# Patient Record
Sex: Female | Born: 2000 | Race: White | Hispanic: No | Marital: Single | Smoking: Never smoker
Health system: Southern US, Community
[De-identification: ages and names within clinical notes are randomized; demographics above are authoritative.]

## PROBLEM LIST (undated history)

## (undated) DIAGNOSIS — B009 Herpesviral infection, unspecified: Secondary | ICD-10-CM

## (undated) HISTORY — PX: WISDOM TOOTH EXTRACTION: SHX21

## (undated) HISTORY — DX: Herpesviral infection, unspecified: B00.9

---
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
## 2019-02-04 ENCOUNTER — Other Ambulatory Visit: Payer: Self-pay | Admitting: Women's Health

## 2019-02-04 DIAGNOSIS — Z3041 Encounter for surveillance of contraceptive pills: Secondary | ICD-10-CM

## 2019-03-20 IMAGING — US US ABDOMEN LIMITED
1 series · 10 of 10 positions shown · non-contrast
Comparison: None.

CLINICAL DATA: Mononucleosis.  Evaluate spleen size.

EXAM:
LIMITED ABDOMINAL ULTRASOUND

[Series 1: us abdomen limited · 0.20mm/px · 10 of 10 slices shown]
[im 1/10]
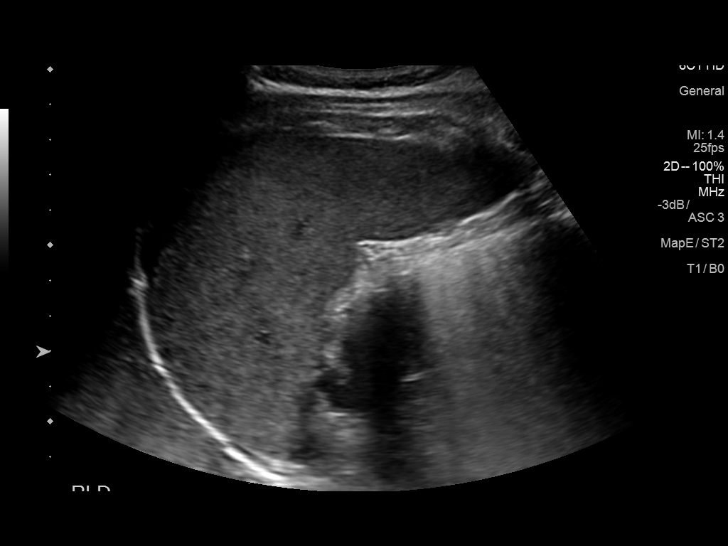
[im 2/10]
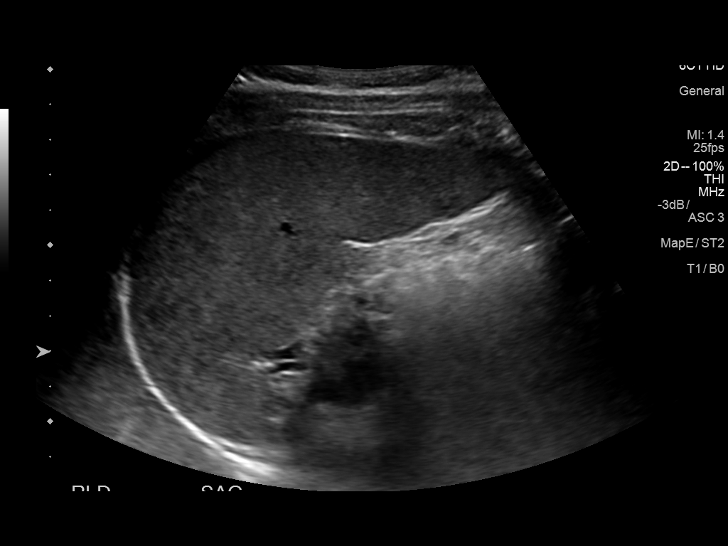
[im 3/10]
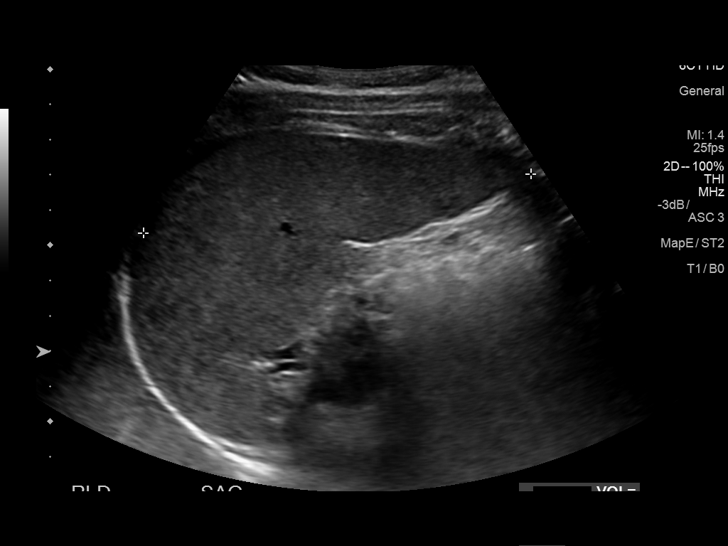
[im 4/10]
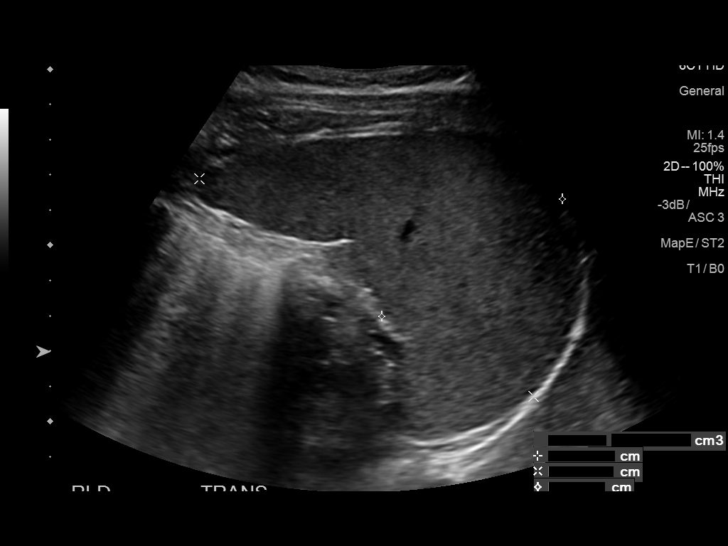
[im 5/10]
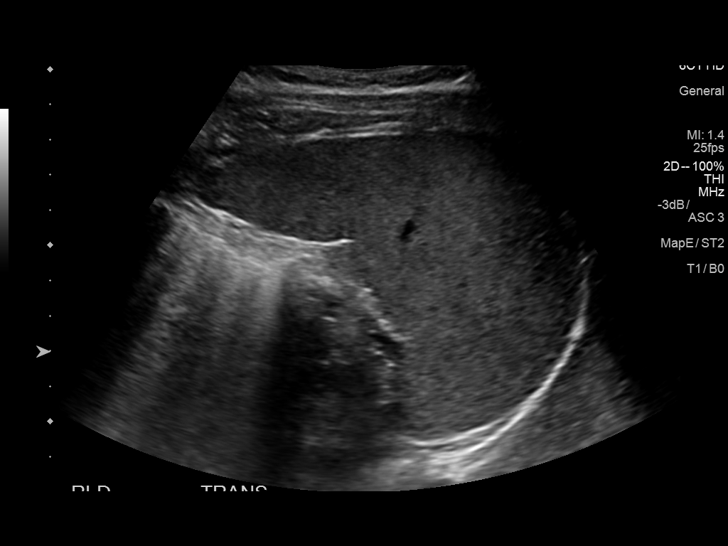
[im 6/10]
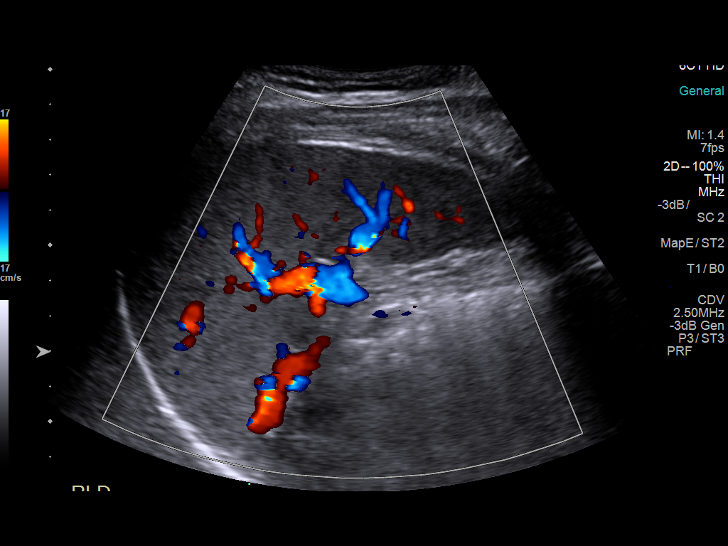
[im 7/10]
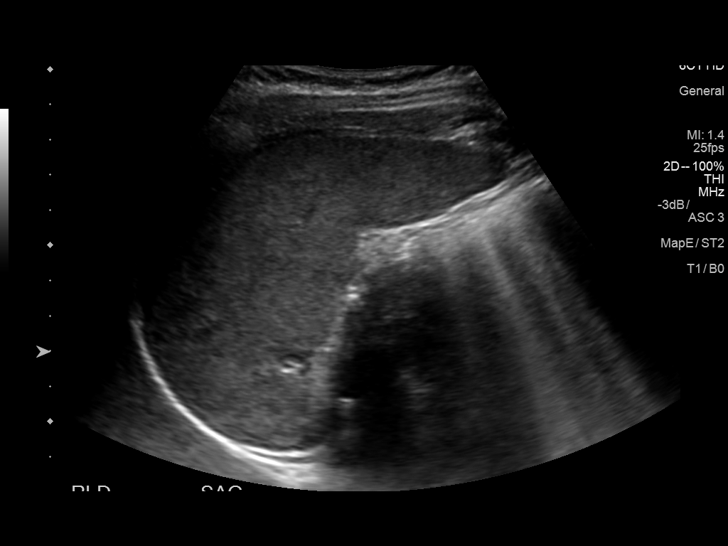
[im 8/10]
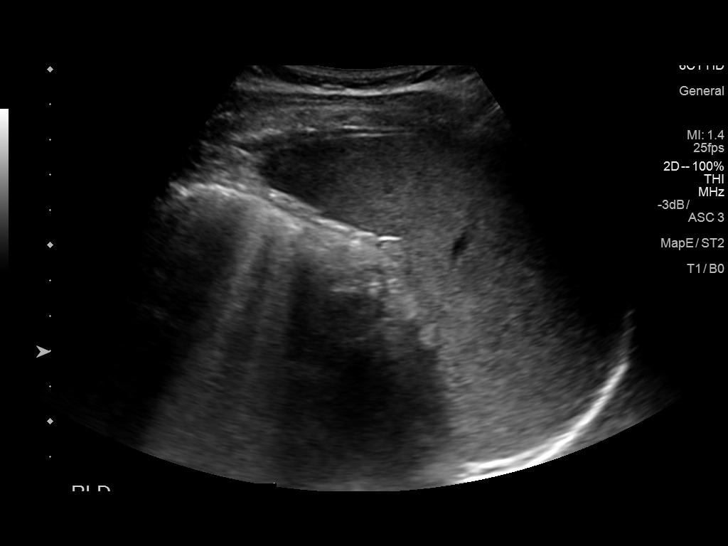
[im 9/10]
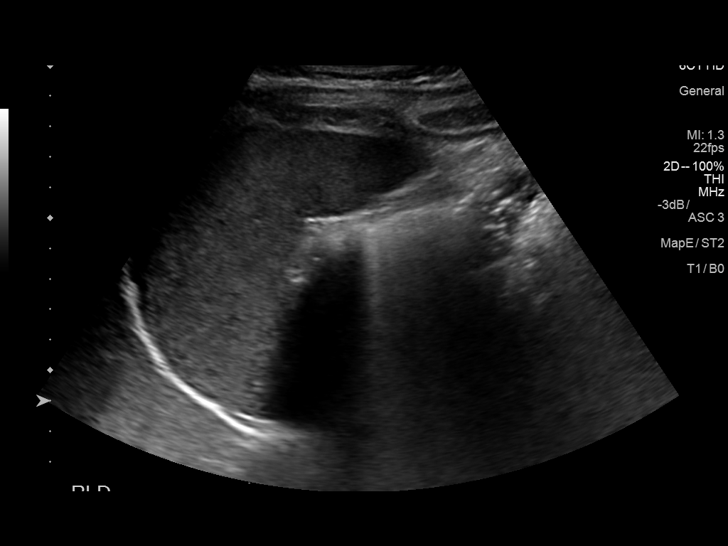
[im 10/10]
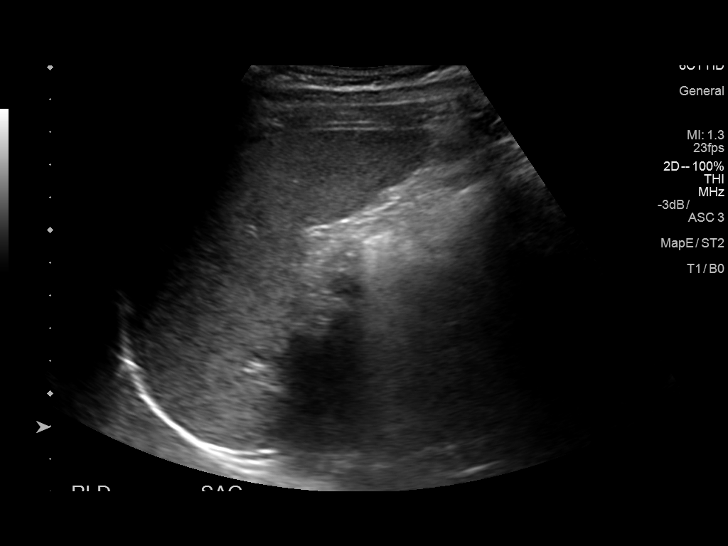

[10 of 10 positions shown; findings below may reference images not displayed]

FINDINGS: Spleen size 11.1 x 11.3 x 6.1 cm for a volume of 402 mL, upper
normal in size. No focal abnormality.
IMPRESSION: No evidence of splenomegaly or focal splenic lesions.

## 2019-03-30 ENCOUNTER — Other Ambulatory Visit: Payer: Self-pay | Admitting: Women's Health

## 2019-03-30 DIAGNOSIS — Z3041 Encounter for surveillance of contraceptive pills: Secondary | ICD-10-CM

## 2019-04-09 IMAGING — CR DG CHEST 2V
2 series · 2 of 2 positions shown · non-contrast
Comparison: Abdominal series today reported separately.

CLINICAL DATA: 15-year-old female with left upper quadrant pain,
shortness of breath. Mononucleosis.

EXAM:
CHEST  2 VIEW

[w chest pa]
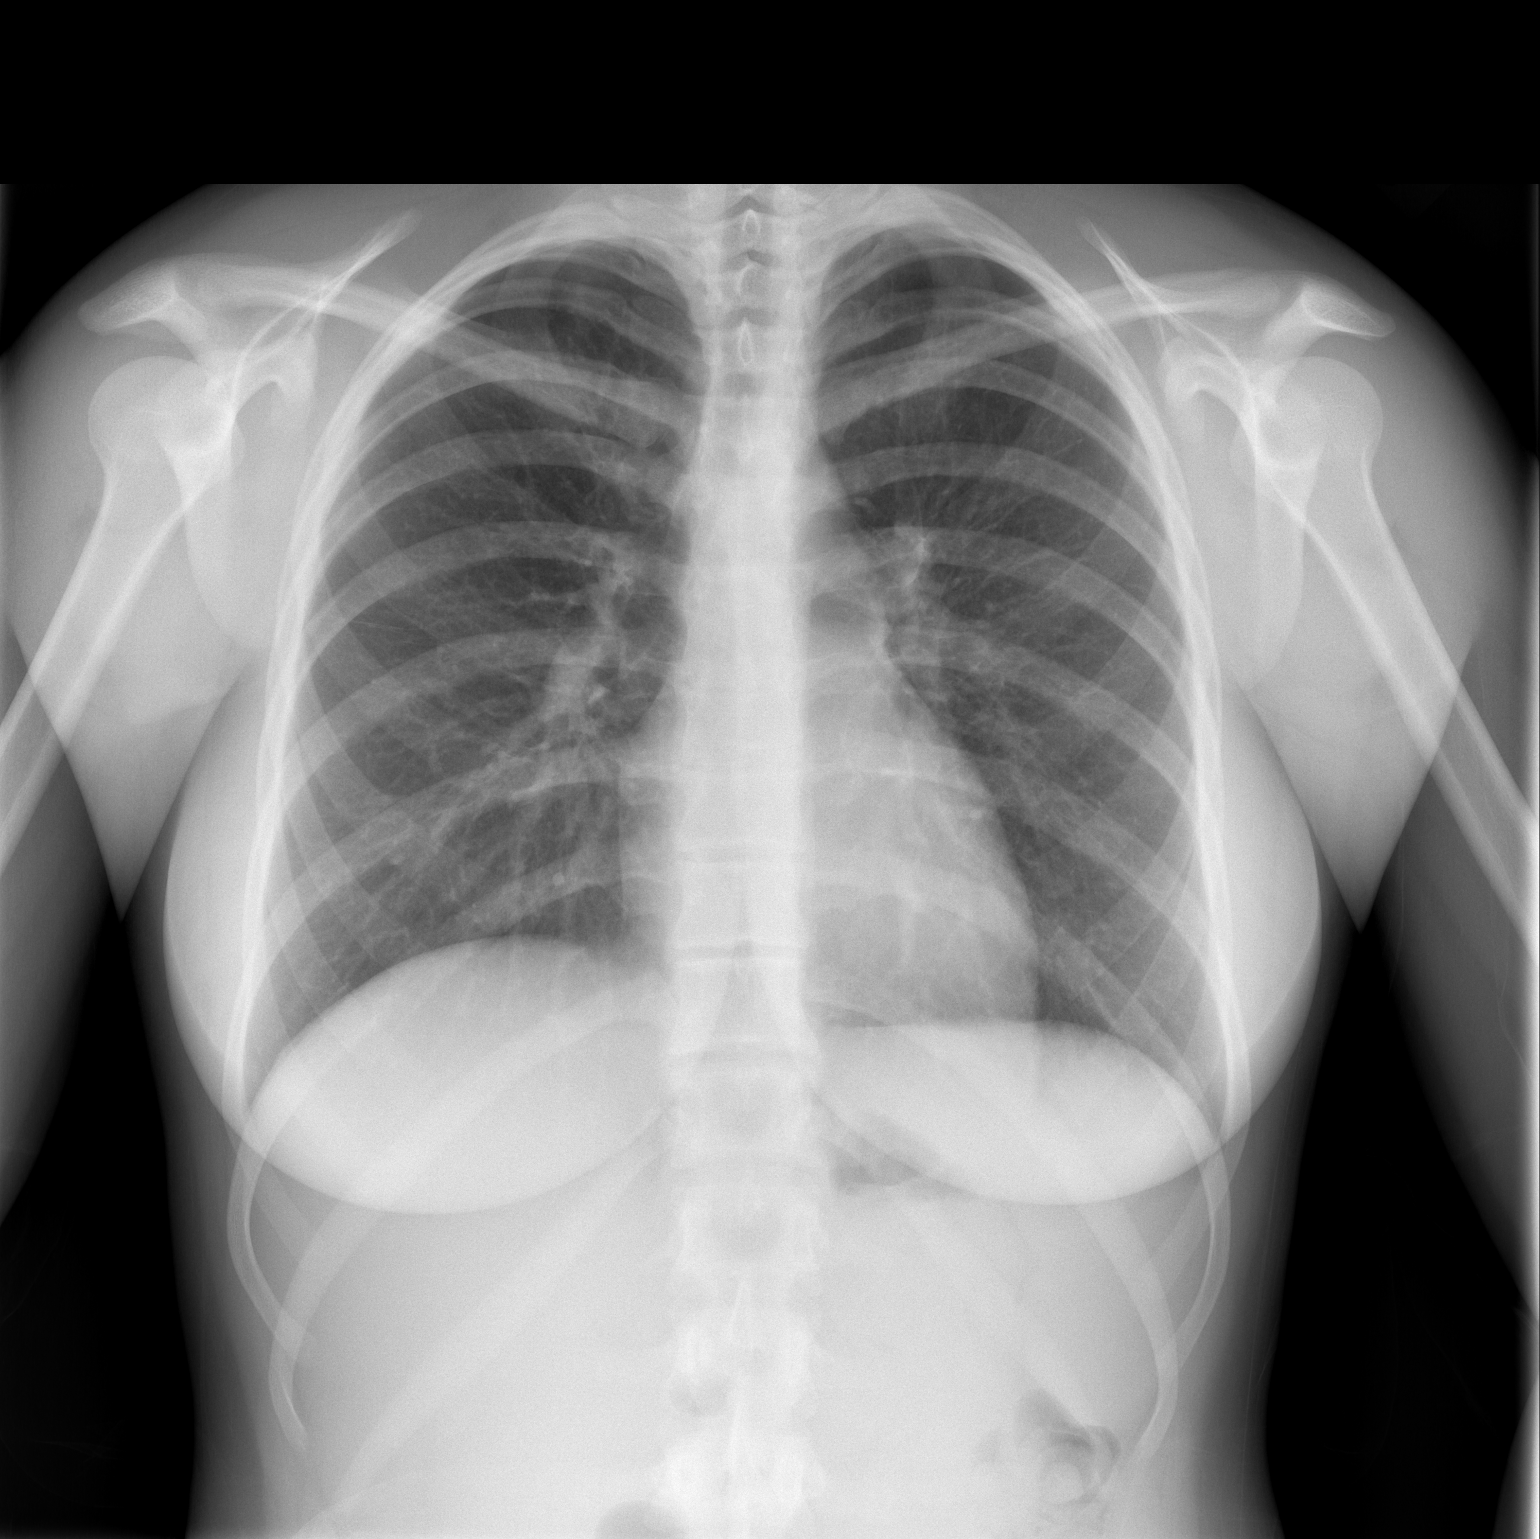

[w chest lat]
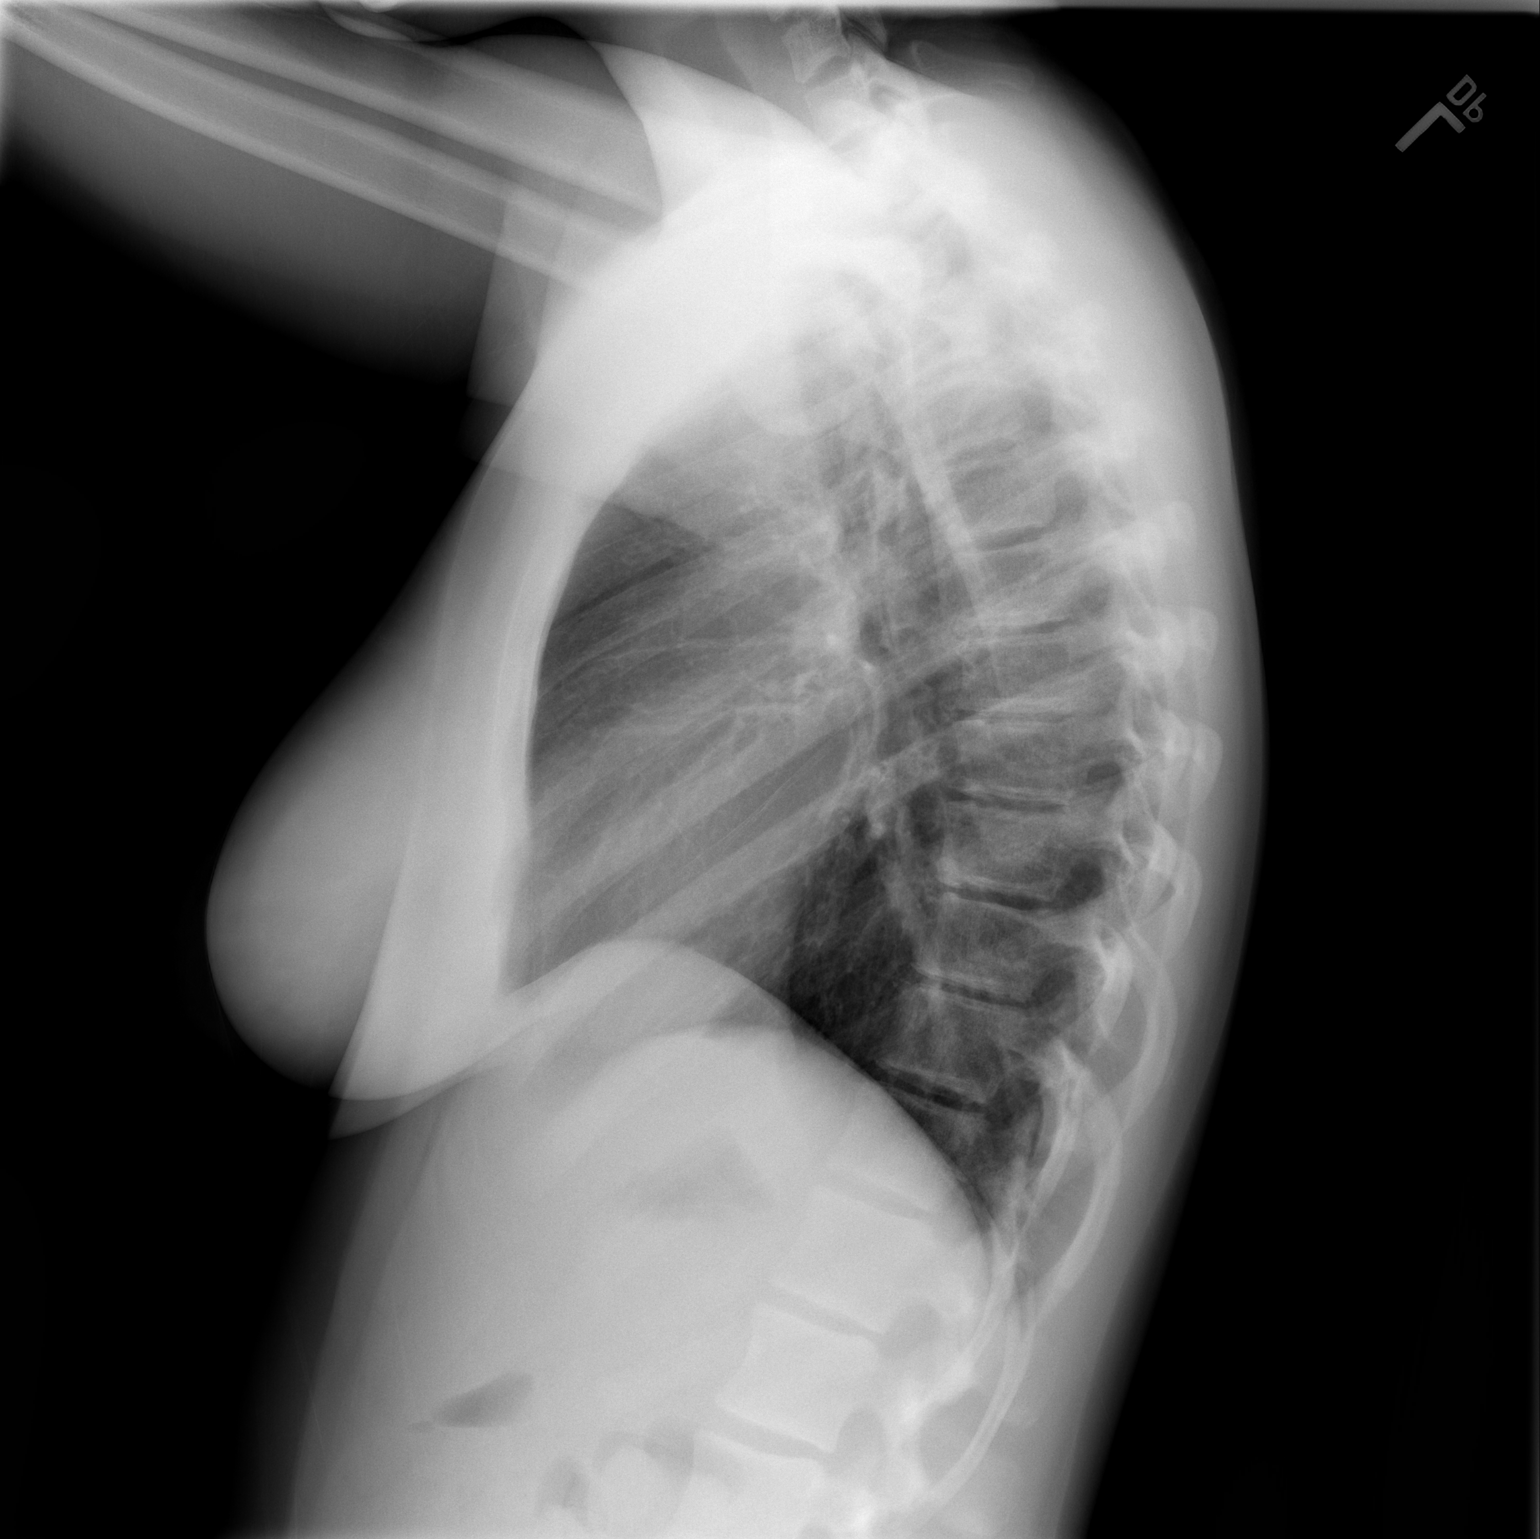

[2 of 2 positions shown; findings below may reference images not displayed]

FINDINGS: Normal lung volumes. Normal cardiac size and mediastinal contours.
Visualized tracheal air column is within normal limits. The lungs
are clear. No pneumothorax or pleural effusion. No pneumoperitoneum.
Negative visible bowel gas pattern. No osseous abnormality
identified.
IMPRESSION: Negative.  No acute cardiopulmonary abnormality.

## 2019-04-10 ENCOUNTER — Telehealth: Payer: Self-pay | Admitting: *Deleted

## 2019-04-10 DIAGNOSIS — Z3041 Encounter for surveillance of contraceptive pills: Secondary | ICD-10-CM

## 2019-04-10 MED ORDER — NORTREL 1/35 (21) 1-35 MG-MCG PO TABS: ORAL_TABLET | ORAL | 0 refills | Status: DC

## 2019-04-10 NOTE — Telephone Encounter (Signed)
Patient has annual exam scheduled on 04/18/19 needs refill on birth control pills. Rx sent.

## 2019-04-17 ENCOUNTER — Other Ambulatory Visit: Payer: Self-pay

## 2019-04-18 ENCOUNTER — Ambulatory Visit (INDEPENDENT_AMBULATORY_CARE_PROVIDER_SITE_OTHER): Payer: Self-pay | Admitting: Women's Health

## 2019-04-18 ENCOUNTER — Encounter: Payer: Self-pay | Admitting: Women's Health

## 2019-04-18 VITALS — BP 124/80 | Ht 65.0 in | Wt 149.0 lb

## 2019-04-18 DIAGNOSIS — Z01419 Encounter for gynecological examination (general) (routine) without abnormal findings: Secondary | ICD-10-CM

## 2019-04-18 MED ORDER — NORGESTIMATE-ETH ESTRADIOL 0.25-35 MG-MCG PO TABS: 1.0000 | ORAL_TABLET | Freq: Every day | ORAL | 4 refills | Status: DC

## 2019-04-18 MED ORDER — ACYCLOVIR 200 MG PO CAPS: 200.0000 mg | ORAL_CAPSULE | Freq: Every day | ORAL | 4 refills | Status: DC

## 2019-04-18 NOTE — Progress Notes (Signed)
[place] D Catterton Mar 01, 2001 [id number]    History:    Presents for annual exam.  Monthly cycle on Nortrel.  Mother lost her job/insurance, increased stress.  Questionable HSV outbreak.  Not sexually active, denies need for STD screen.  History of HSV greater than 1 year ago only one outbreak.  Gardasil series completed.  Past medical history, past surgical history, family history and social history were all reviewed and documented in the EPIC chart.  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  Mother hypertension,.  ROS:  A ROS was performed and pertinent positives and negatives are included.  Exam:  Vitals:   04/18/19 1407  BP: 124/80  Weight: 149 lb (67.6 kg)  Height: 5\' 5"  (1.651 m)   Body mass index is 24.79 kg/m.   General appearance:  Normal Thyroid:  Symmetrical, normal in size, without palpable masses or nodularity. Respiratory  Auscultation:  Clear without wheezing or rhonchi Cardiovascular  Auscultation:  Regular rate, without rubs, murmurs or gallops  Edema/varicosities:  Not grossly evident Abdominal  Soft,nontender, without masses, guarding or rebound.  Liver/spleen:  No organomegaly noted  Hernia:  None appreciated  Skin  Inspection:  Grossly normal   Breasts: Examined lying and sitting.     Right: Without masses, retractions, discharge or axillary adenopathy.     Left: Without masses, retractions, discharge or axillary adenopathy. Gentitourinary   Inguinal/mons:  Normal without inguinal adenopathy  External genitalia: Right labia several HSV appearing lesions  BUS/Urethra/Skene's glands:  Normal  Vagina:  Normal  Cervix:  Normal  Uterus:   normal in size, shape and contour.  Midline and mobile  Adnexa/parametria:     Rt: Without masses or tenderness.   Lt: Without masses or tenderness.  Anus and perineum: Normal   Assessment/Plan:  18 y.o. S WF G0 for annual exam with complaint of rt labia discomfort without  itching or odor..     Monthly cycle Nortrel HSV 2 outbreak  Plan: Sprintec prescription, proper use, slight risk for blood clots and strokes reviewed.  Reviewed very similar to Nortrel but will cost less at [facility]..  Encouraged condoms when sexually active, denies need for STD screen.  Acyclovir 200 mg 5 times daily as needed as needed, will use the Valtrex she has at home twice daily.  SBEs, exercise, calcium rich foods, sunscreen, MVI daily encouraged.  Campus safety discussed.  Declined blood work.  Pap screening guidelines reviewed.    Huel Cote [facility], 2:52 PM 04/18/2019

## 2019-04-18 NOTE — Patient Instructions (Signed)
Health Maintenance, Female Adopting a healthy lifestyle and getting preventive care are important in promoting health and wellness. Ask your health care provider about:  The right schedule for you to have regular tests and exams.  Things you can do on your own to prevent diseases and keep yourself healthy. What should I know about diet, weight, and exercise? Eat a healthy diet   Eat a diet that includes plenty of vegetables, fruits, low-fat dairy products, and lean protein.  Do not eat a lot of foods that are high in solid fats, added sugars, or sodium. Maintain a healthy weight Body mass index (BMI) is used to identify weight problems. It estimates body fat based on height and weight. Your health care provider can help determine your BMI and help you achieve or maintain a healthy weight. Get regular exercise Get regular exercise. This is one of the most important things you can do for your health. Most adults should:  Exercise for at least 150 minutes each week. The exercise should increase your heart rate and make you sweat (moderate-intensity exercise).  Do strengthening exercises at least twice a week. This is in addition to the moderate-intensity exercise.  Spend less time sitting. Even light physical activity can be beneficial. Watch cholesterol and blood lipids Have your blood tested for lipids and cholesterol at 20 years of age, then have this test every 5 years. Have your cholesterol levels checked more often if:  Your lipid or cholesterol levels are high.  You are older than 19 years of age.  You are at high risk for heart disease. What should I know about cancer screening? Depending on your health history and family history, you may need to have cancer screening at various ages. This may include screening for:  Breast cancer.  Cervical cancer.  Colorectal cancer.  Skin cancer.  Lung cancer. What should I know about heart disease, diabetes, and high blood  pressure? Blood pressure and heart disease  High blood pressure causes heart disease and increases the risk of stroke. This is more likely to develop in people who have high blood pressure readings, are of African descent, or are overweight.  Have your blood pressure checked: ? Every 3-5 years if you are 18-39 years of age. ? Every year if you are 40 years old or older. Diabetes Have regular diabetes screenings. This checks your fasting blood sugar level. Have the screening done:  Once every three years after age 40 if you are at a normal weight and have a low risk for diabetes.  More often and at a younger age if you are overweight or have a high risk for diabetes. What should I know about preventing infection? Hepatitis B If you have a higher risk for hepatitis B, you should be screened for this virus. Talk with your health care provider to find out if you are at risk for hepatitis B infection. Hepatitis C Testing is recommended for:  Everyone born from 1945 through 1965.  Anyone with known risk factors for hepatitis C. Sexually transmitted infections (STIs)  Get screened for STIs, including gonorrhea and chlamydia, if: ? You are sexually active and are younger than 19 years of age. ? You are older than 19 years of age and your health care provider tells you that you are at risk for this type of infection. ? Your sexual activity has changed since you were last screened, and you are at increased risk for chlamydia or gonorrhea. Ask your health care provider if   you are at risk.  Ask your health care provider about whether you are at high risk for HIV. Your health care provider may recommend a prescription medicine to help prevent HIV infection. If you choose to take medicine to prevent HIV, you should first get tested for HIV. You should then be tested every 3 months for as long as you are taking the medicine. Pregnancy  If you are about to stop having your period (premenopausal) and  you may become pregnant, seek counseling before you get pregnant.  Take 400 to 800 micrograms (mcg) of folic acid every day if you become pregnant.  Ask for birth control (contraception) if you want to prevent pregnancy. Osteoporosis and menopause Osteoporosis is a disease in which the bones lose minerals and strength with aging. This can result in bone fractures. If you are 65 years old or older, or if you are at risk for osteoporosis and fractures, ask your health care provider if you should:  Be screened for bone loss.  Take a calcium or vitamin D supplement to lower your risk of fractures.  Be given hormone replacement therapy (HRT) to treat symptoms of menopause. Follow these instructions at home: Lifestyle  Do not use any products that contain nicotine or tobacco, such as cigarettes, e-cigarettes, and chewing tobacco. If you need help quitting, ask your health care provider.  Do not use street drugs.  Do not share needles.  Ask your health care provider for help if you need support or information about quitting drugs. Alcohol use  Do not drink alcohol if: ? Your health care provider tells you not to drink. ? You are pregnant, may be pregnant, or are planning to become pregnant.  If you drink alcohol: ? Limit how much you use to 0-1 drink a day. ? Limit intake if you are breastfeeding.  Be aware of how much alcohol is in your drink. In the [place] one drink equals one 12 oz bottle of beer (355 mL), one 5 oz glass of wine (148 mL), or one 1 oz glass of hard liquor (44 mL). General instructions  Schedule regular health, dental, and eye exams.  Stay current with your vaccines.  Tell your health care provider if: ? You often feel depressed. ? You have ever been abused or do not feel safe at home. Summary  Adopting a healthy lifestyle and getting preventive care are important in promoting health and wellness.  Follow your health care provider's instructions about healthy  diet, exercising, and getting tested or screened for diseases.  Follow your health care provider's instructions on monitoring your cholesterol and blood pressure. This information is not intended to replace advice given to you by your health care provider. Make sure you discuss any questions you have with your health care provider. Document Released: 04/05/2011 Document Revised: 09/13/2018 Document Reviewed: 09/13/2018 Elsevier Patient Education  2020 Elsevier Inc.  

## 2020-05-15 ENCOUNTER — Ambulatory Visit (INDEPENDENT_AMBULATORY_CARE_PROVIDER_SITE_OTHER): Payer: Medicaid Other | Admitting: Nurse Practitioner

## 2020-05-15 ENCOUNTER — Other Ambulatory Visit: Payer: Self-pay

## 2020-05-15 ENCOUNTER — Encounter: Payer: Self-pay | Admitting: Nurse Practitioner

## 2020-05-15 VITALS — BP 116/72 | Ht 65.0 in | Wt 137.0 lb

## 2020-05-15 DIAGNOSIS — N898 Other specified noninflammatory disorders of vagina: Secondary | ICD-10-CM | POA: Diagnosis not present

## 2020-05-15 DIAGNOSIS — B373 Candidiasis of vulva and vagina: Secondary | ICD-10-CM

## 2020-05-15 DIAGNOSIS — B360 Pityriasis versicolor: Secondary | ICD-10-CM

## 2020-05-15 DIAGNOSIS — B009 Herpesviral infection, unspecified: Secondary | ICD-10-CM | POA: Diagnosis not present

## 2020-05-15 DIAGNOSIS — R300 Dysuria: Secondary | ICD-10-CM | POA: Diagnosis not present

## 2020-05-15 DIAGNOSIS — Z01419 Encounter for gynecological examination (general) (routine) without abnormal findings: Secondary | ICD-10-CM | POA: Diagnosis not present

## 2020-05-15 DIAGNOSIS — Z3041 Encounter for surveillance of contraceptive pills: Secondary | ICD-10-CM | POA: Diagnosis not present

## 2020-05-15 DIAGNOSIS — B3731 Acute candidiasis of vulva and vagina: Secondary | ICD-10-CM

## 2020-05-15 DIAGNOSIS — A609 Anogenital herpesviral infection, unspecified: Secondary | ICD-10-CM

## 2020-05-15 LAB — WET PREP FOR TRICH, YEAST, CLUE

## 2020-05-15 MED ORDER — NORGESTIMATE-ETH ESTRADIOL 0.25-35 MG-MCG PO TABS: 1.0000 | ORAL_TABLET | Freq: Every day | ORAL | 4 refills | Status: DC

## 2020-05-15 MED ORDER — KETOCONAZOLE 2 % EX CREA: 1.0000 "application " | TOPICAL_CREAM | Freq: Every day | CUTANEOUS | 0 refills | Status: AC

## 2020-05-15 MED ORDER — FLUCONAZOLE 150 MG PO TABS: 150.0000 mg | ORAL_TABLET | Freq: Once | ORAL | 0 refills | Status: AC

## 2020-05-15 MED ORDER — ACYCLOVIR 200 MG PO CAPS: 200.0000 mg | ORAL_CAPSULE | Freq: Every day | ORAL | 0 refills | Status: DC

## 2020-05-15 MED ORDER — VALACYCLOVIR HCL 500 MG PO TABS: ORAL_TABLET | ORAL | 1 refills | Status: AC

## 2020-05-15 NOTE — Patient Instructions (Addendum)
Apply cream to rash for 1 week after its gone  Health Maintenance, Female Adopting a healthy lifestyle and getting preventive care are important in promoting health and wellness. Ask your health care provider about:  The right schedule for you to have regular tests and exams.  Things you can do on your own to prevent diseases and keep yourself healthy. What should I know about diet, weight, and exercise? Eat a healthy diet   Eat a diet that includes plenty of vegetables, fruits, low-fat dairy products, and lean protein.  Do not eat a lot of foods that are high in solid fats, added sugars, or sodium. Maintain a healthy weight Body mass index (BMI) is used to identify weight problems. It estimates body fat based on height and weight. Your health care provider can help determine your BMI and help you achieve or maintain a healthy weight. Get regular exercise Get regular exercise. This is one of the most important things you can do for your health. Most adults should:  Exercise for at least 150 minutes each week. The exercise should increase your heart rate and make you sweat (moderate-intensity exercise).  Do strengthening exercises at least twice a week. This is in addition to the moderate-intensity exercise.  Spend less time sitting. Even light physical activity can be beneficial. Watch cholesterol and blood lipids Have your blood tested for lipids and cholesterol at 20 years of age, then have this test every 5 years. Have your cholesterol levels checked more often if:  Your lipid or cholesterol levels are high.  You are older than 20 years of age.  You are at high risk for heart disease. What should I know about cancer screening? Depending on your health history and family history, you may need to have cancer screening at various ages. This may include screening for:  Breast cancer.  Cervical cancer.  Colorectal cancer.  Skin cancer.  Lung cancer. What should I know  about heart disease, diabetes, and high blood pressure? Blood pressure and heart disease  High blood pressure causes heart disease and increases the risk of stroke. This is more likely to develop in people who have high blood pressure readings, are of African descent, or are overweight.  Have your blood pressure checked: ? Every 3-5 years if you are 78-68 years of age. ? Every year if you are 37 years old or older. Diabetes Have regular diabetes screenings. This checks your fasting blood sugar level. Have the screening done:  Once every three years after age 72 if you are at a normal weight and have a low risk for diabetes.  More often and at a younger age if you are overweight or have a high risk for diabetes. What should I know about preventing infection? Hepatitis B If you have a higher risk for hepatitis B, you should be screened for this virus. Talk with your health care provider to find out if you are at risk for hepatitis B infection. Hepatitis C Testing is recommended for:  Everyone born from [birth year] through 1965.  Anyone with known risk factors for hepatitis C. Sexually transmitted infections (STIs)  Get screened for STIs, including gonorrhea and chlamydia, if: ? You are sexually active and are younger than 20 years of age. ? You are older than 20 years of age and your health care provider tells you that you are at risk for this type of infection. ? Your sexual activity has changed since you were last screened, and you are at increased  risk for chlamydia or gonorrhea. Ask your health care provider if you are at risk.  Ask your health care provider about whether you are at high risk for HIV. Your health care provider may recommend a prescription medicine to help prevent HIV infection. If you choose to take medicine to prevent HIV, you should first get tested for HIV. You should then be tested every 3 months for as long as you are taking the medicine. Pregnancy  If you are about  to stop having your period (premenopausal) and you may become pregnant, seek counseling before you get pregnant.  Take 400 to 800 micrograms (mcg) of folic acid every day if you become pregnant.  Ask for birth control (contraception) if you want to prevent pregnancy. Osteoporosis and menopause Osteoporosis is a disease in which the bones lose minerals and strength with aging. This can result in bone fractures. If you are 6 years old or older, or if you are at risk for osteoporosis and fractures, ask your health care provider if you should:  Be screened for bone loss.  Take a calcium or vitamin D supplement to lower your risk of fractures.  Be given hormone replacement therapy (HRT) to treat symptoms of menopause. Follow these instructions at home: Lifestyle  Do not use any products that contain nicotine or tobacco, such as cigarettes, e-cigarettes, and chewing tobacco. If you need help quitting, ask your health care provider.  Do not use street drugs.  Do not share needles.  Ask your health care provider for help if you need support or information about quitting drugs. Alcohol use  Do not drink alcohol if: ? Your health care provider tells you not to drink. ? You are pregnant, may be pregnant, or are planning to become pregnant.  If you drink alcohol: ? Limit how much you use to 0-1 drink a day. ? Limit intake if you are breastfeeding.  Be aware of how much alcohol is in your drink. In the [place] one drink equals one 12 oz bottle of beer (355 mL), one 5 oz glass of wine (148 mL), or one 1 oz glass of hard liquor (44 mL). General instructions  Schedule regular health, dental, and eye exams.  Stay current with your vaccines.  Tell your health care provider if: ? You often feel depressed. ? You have ever been abused or do not feel safe at home. Summary  Adopting a healthy lifestyle and getting preventive care are important in promoting health and wellness.  Follow your  health care provider's instructions about healthy diet, exercising, and getting tested or screened for diseases.  Follow your health care provider's instructions on monitoring your cholesterol and blood pressure. This information is not intended to replace advice given to you by your health care provider. Make sure you discuss any questions you have with your health care provider. Document Revised: 09/13/2018 Document Reviewed: 09/13/2018 Elsevier Patient Education  2020 Elsevier Inc.  Tinea Versicolor  Tinea versicolor is a common fungal infection of the skin. It causes a rash that appears as light or dark patches on the skin. The rash most often occurs on the chest, back, neck, or upper arms. This condition is more common during warm weather. Other than affecting how your skin looks, tinea versicolor usually does not cause other problems. In most cases, the infection goes away in a few weeks with treatment. It may take a few months for the patches on your skin to return to your usual skin color. What are  the causes? This condition occurs when a type of fungus that is normally present on the skin starts to overgrow. This fungus is a kind of yeast. The exact cause of the overgrowth is not known. This condition cannot be passed from one person to another (it is not contagious). What increases the risk? This condition is more likely to develop when certain factors are present, such as:  Heat and humidity.  Sweating too much.  Hormone changes.  Oily skin.  A weak disease-fighting system (immunesystem). What are the signs or symptoms? Symptoms of this condition include:  A rash of light or dark patches on your skin. The rash may have: ? Patches of tan or pink spots (on light skin). ? Patches of white or brown spots (on dark skin). ? Patches of skin that do not tan. ? Well-marked edges. ? Scales on the discolored areas.  Mild itching. How is this diagnosed? A health care provider can  usually diagnose this condition by looking at your skin. During the exam, he or she may use ultraviolet (UV) light to see how much of your skin has been affected. In some cases, a skin sample may be taken by scraping the rash. This sample will be viewed under a microscope to check for yeast overgrowth. How is this treated? Treatment for this condition may include:  Dandruff shampoo that is applied to the affected skin during showers or bathing.  Over-the-counter medicated skin cream, lotion, or soaps.  Prescription antifungal medicine in the form of skin cream or pills.  Medicine to help reduce itching. Follow these instructions at home:  Take over-the-counter and prescription medicines only as told by your health care provider.  Apply dandruff shampoo to the affected area if your health care provider told you to do that. You may be instructed to scrub the affected skin for several minutes each day.  Do not scratch the affected area of skin.  Avoid hot and humid conditions.  Do not use tanning booths.  Try to avoid sweating a lot. Contact a health care provider if:  Your symptoms get worse.  You have a fever.  You have redness, swelling, or pain at the site of your rash.  You have fluid or blood coming from your rash.  Your rash feels warm to the touch.  You have pus or a bad smell coming from your rash.  Your rash returns (recurs) after treatment. Summary  Tinea versicolor is a common fungal infection of the skin. It causes a rash that appears as light or dark patches on the skin.  The rash most often occurs on the chest, back, neck, or upper arms.  A health care provider can usually diagnose this condition by looking at your skin.  Treatment may include applying shampoo to the skin and taking or applying medicines. This information is not intended to replace advice given to you by your health care provider. Make sure you discuss any questions you have with your  health care provider. Document Revised: 09/02/2017 Document Reviewed: 05/24/2017 Elsevier Patient Education  2020 [employer].

## 2020-05-15 NOTE — Progress Notes (Signed)
   [place] D Zollars 02-27-2001 [id number]   History:  19 y.o. G0 presents for annual exam with complains of burning with urination at start of stream and then some urethral discomfort afterwards. She denies vaginal symptoms, urinary frequency, urgency, and hematuria. Regular, monthly cycle on OCPs. Not currently sexually active. 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 Gardasil series completed.   Gynecologic History Patient's last menstrual period was 05/01/2020. Period Cycle (Days): 28 Period Duration (Days): 7 Period Pattern: Regular Menstrual Flow: Moderate, Heavy Dysmenorrhea: (!) Moderate Dysmenorrhea Symptoms: Cramping Contraception: OCP (estrogen/progesterone)   Past medical history, past surgical history, family history and social history were all reviewed and documented in the EPIC chart.  ROS:  A ROS was performed and pertinent positives and negatives are included.  Exam:  Vitals:   05/15/20 1127  BP: 116/72  Weight: 137 lb (62.1 kg)  Height: 5\' 5"  (1.651 m)   Body mass index is 22.8 kg/m.  General appearance:  Normal Thyroid:  Symmetrical, normal in size, without palpable masses or nodularity. Respiratory  Auscultation:  Clear without wheezing or rhonchi Cardiovascular  Auscultation:  Regular rate, without rubs, murmurs or gallops  Edema/varicosities:  Not grossly evident Abdominal  Soft,nontender, without masses, guarding or rebound.  Liver/spleen:  No organomegaly noted  Hernia:  None appreciated  Skin  Inspection: scattered hypopigmented areas on back and shoulders consistent with tinea versicolor Breasts: Examined lying and sitting.   Right: Without masses, retractions, discharge or axillary adenopathy.   Left: Without masses, retractions, discharge or axillary adenopathy. Gentitourinary   Inguinal/mons:  Normal without inguinal adenopathy  External genitalia:  Normal  BUS/Urethra/Skene's glands:  Normal  Vagina:  Thick, white discharge, mild erythema  Cervix:   Ectropion  Uterus:  Anteverted, normal in size, shape and contour.  Midline and mobile  Adnexa/parametria:     Rt: Without masses or tenderness.   Lt: Without masses or tenderness.  Anus and perineum: Normal  Wet prep + yeast UA: trace leukocytes, no blood, wbc 6-10, rbc none seen, yeast seen  Assessment/Plan:  19 y.o. G0 for annual exam.   Well female exam with routine gynecological exam - Education provided on SBEs, importance of preventative screenings, current guidelines, high calcium diet, regular exercise, and safe sex practices with condom use until permanent partner.   Encounter for surveillance of contraceptive pills - Plan: norgestimate-ethinyl estradiol (ORTHO-CYCLEN) 0.25-35 MG-MCG tablet. Taking as prescribed. Refill x 1 year provided.  Vaginal discharge - Plan: WET PREP FOR TRICH, YEAST, CLUE  Dysuria - Plan: Urinalysis w microscopic + reflex culture  Tinea versicolor - Plan: ketoconazole (NIZORAL) 2 % cream. Apply daily for up to 22 days. Instructed to continue to apply 1 week after rash is gone  HSV (herpes simplex virus) anogenital infection - Plan: valACYclovir (VALTREX) 500 MG tablet. No outbreaks since initial. Valtrex as needed #30 with 1 refill provided.  Vulvovaginal candidiasis - Plan: fluconazole (DIFLUCAN) 150 MG tablet x 1  Follow up in 1 year for annual       06-20-2000 [facility], 11:36 AM 05/15/2020

## 2020-05-17 LAB — CULTURE INDICATED

## 2020-05-17 LAB — URINALYSIS W MICROSCOPIC + REFLEX CULTURE
Bilirubin Urine: NEGATIVE
Glucose, UA: NEGATIVE
Hgb urine dipstick: NEGATIVE
Hyaline Cast: NONE SEEN /LPF
Ketones, ur: NEGATIVE
Nitrites, Initial: NEGATIVE
Protein, ur: NEGATIVE
RBC / HPF: NONE SEEN /HPF (ref 0–2)
Specific Gravity, Urine: 1.025 (ref 1.001–1.03)
pH: 6 (ref 5.0–8.0)

## 2020-05-17 LAB — URINE CULTURE
MICRO NUMBER:: 10819257
Result:: NO GROWTH
SPECIMEN QUALITY:: ADEQUATE

## 2020-05-27 ENCOUNTER — Encounter: Payer: Self-pay | Admitting: Nurse Practitioner

## 2021-04-28 ENCOUNTER — Other Ambulatory Visit: Payer: Self-pay

## 2021-04-28 DIAGNOSIS — Z3041 Encounter for surveillance of contraceptive pills: Secondary | ICD-10-CM

## 2021-04-28 MED ORDER — NORGESTIMATE-ETH ESTRADIOL 0.25-35 MG-MCG PO TABS: 1.0000 | ORAL_TABLET | Freq: Every day | ORAL | 0 refills | Status: DC

## 2021-04-28 NOTE — Telephone Encounter (Signed)
Last AEX 05/15/20 with TW, NP.  Patient was transferred to triage voice mail from the appointment desk. 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  They have her on a cancellation list but she is going to run out of bc pills and asked if you will refill for her.

## 2021-07-13 ENCOUNTER — Other Ambulatory Visit: Payer: Self-pay | Admitting: Nurse Practitioner

## 2021-07-13 DIAGNOSIS — Z3041 Encounter for surveillance of contraceptive pills: Secondary | ICD-10-CM

## 2021-07-13 NOTE — Telephone Encounter (Signed)
Spoke with patient and let her know refill sent.

## 2021-07-13 NOTE — Telephone Encounter (Signed)
Last AEX 8.12.21. Patient not yet scheduled. Said she is out of pills and heading back to school before days end and is "urgently requesting refill".  I will have the front desk contact her to schedule AEX for when she comes home again.

## 2021-07-13 NOTE — Telephone Encounter (Signed)
Pt was scheduled for 07/14/21.

## 2021-07-14 ENCOUNTER — Encounter: Payer: Self-pay | Admitting: Nurse Practitioner

## 2021-07-14 ENCOUNTER — Ambulatory Visit (INDEPENDENT_AMBULATORY_CARE_PROVIDER_SITE_OTHER): Payer: Medicaid Other | Admitting: Nurse Practitioner

## 2021-07-14 ENCOUNTER — Other Ambulatory Visit: Payer: Self-pay

## 2021-07-14 ENCOUNTER — Other Ambulatory Visit (HOSPITAL_COMMUNITY)
Admission: RE | Admit: 2021-07-14 | Discharge: 2021-07-14 | Disposition: A | Discharge status: Home / Self Care | Payer: Medicaid Other | Source: Ambulatory Visit | Attending: Nurse Practitioner | Admitting: Nurse Practitioner

## 2021-07-14 VITALS — BP 104/70 | HR 62 | Resp 14 | Ht 64.5 in | Wt 136.5 lb

## 2021-07-14 DIAGNOSIS — Z3041 Encounter for surveillance of contraceptive pills: Secondary | ICD-10-CM

## 2021-07-14 DIAGNOSIS — Z01419 Encounter for gynecological examination (general) (routine) without abnormal findings: Secondary | ICD-10-CM | POA: Diagnosis not present

## 2021-07-14 DIAGNOSIS — B009 Herpesviral infection, unspecified: Secondary | ICD-10-CM | POA: Diagnosis not present

## 2021-07-14 NOTE — Progress Notes (Signed)
   [place] D Lacewell May 16, 2001 [id number]   History:  20 y.o. G0 presents for annual exam. No GYN complaints. Monthly cycle on OCPs. 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 Gardasil series completed.   Gynecologic History Patient's last menstrual period was 06/16/2021. Period Cycle (Days): 30 Period Duration (Days): 6-8 Period Pattern: Regular Menstrual Flow: Heavy Menstrual Control: Tampon, Maxi pad Menstrual Control Change Freq (Hours): changes tampon/pad 3-4 times a day Dysmenorrhea: None Contraception: OCP (estrogen/progesterone) Sexually active: No  Health Maintenance Last Pap: never Last mammogram: Not indicated Last colonoscopy: Not indicated Last Dexa: Not indicated  Past medical history, past surgical history, family history and social history were all reviewed and documented in the EPIC chart. 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   ROS:  A ROS was performed and pertinent positives and negatives are included.  Exam:  Vitals:   07/14/21 0956  BP: 104/70  Pulse: 62  Resp: 14  Weight: 136 lb 8 oz (61.9 kg)  Height: 5' 4.5" (1.638 m)    Body mass index is 23.07 kg/m.  General appearance:  Normal Thyroid:  Symmetrical, normal in size, without palpable masses or nodularity. Respiratory  Auscultation:  Clear without wheezing or rhonchi Cardiovascular  Auscultation:  Regular rate, without rubs, murmurs or gallops  Edema/varicosities:  Not grossly evident Abdominal  Soft,nontender, without masses, guarding or rebound.  Liver/spleen:  No organomegaly noted  Hernia:  None appreciated  Skin  Inspection: scattered hypopigmented areas on back and shoulders consistent with tinea versicolor Breasts: Not indicated per guidelines.  Genitourinary   Inguinal/mons:  Normal without inguinal adenopathy  External genitalia:  Normal appearing vulva with no masses, tenderness, or lesions  BUS/Urethra/Skene's glands:  Normal  Vagina:  Normal appearing with normal color and discharge, no  lesions  Cervix:  Normal appearing without discharge or lesions  Uterus:  Normal in size, shape and contour.  Midline and mobile, nontender  Adnexa/parametria:     Rt: Normal in size, without masses or tenderness.   Lt: Normal in size, without masses or tenderness.  Anus and perineum: Normal  Patient informed chaperone available to be present for breast and pelvic exam. Patient has requested no chaperone to be present. Patient has been advised what will be completed during breast and pelvic exam.   Assessment/Plan:  20 y.o. G0 for annual exam.   Well female exam with routine gynecological exam - Education provided on SBEs, importance of preventative screenings, current guidelines, high calcium diet, regular exercise, and safe sex practices with condom use until permanent partner.   Encounter for surveillance of contraceptive pills - Plan: norgestimate-ethinyl estradiol (ORTHO-CYCLEN) 0.25-35 MG-MCG tablet. Taking as prescribed.   Screening for cervical cancer - First pap today.   Follow up in 1 year for annual.      Olivia Mackie [facility], 10:05 AM 07/14/2021

## 2021-07-15 LAB — CYTOLOGY - PAP: Diagnosis: NEGATIVE
